# Patient Record
Sex: Female | Born: 1995 | Hispanic: Yes | Marital: Single | Smoking: Never smoker
Health system: Southern US, Community
[De-identification: ages and names within clinical notes are randomized; demographics above are authoritative.]

## PROBLEM LIST (undated history)

## (undated) DIAGNOSIS — N200 Calculus of kidney: Secondary | ICD-10-CM

---

## 2016-04-13 ENCOUNTER — Encounter (HOSPITAL_COMMUNITY): Payer: Self-pay | Admitting: *Deleted

## 2016-04-13 ENCOUNTER — Ambulatory Visit (HOSPITAL_COMMUNITY)
Admission: EM | Admit: 2016-04-13 | Discharge: 2016-04-13 | Disposition: A | Discharge status: Home / Self Care | Payer: Medicaid Other | Attending: Family Medicine | Admitting: Family Medicine

## 2016-04-13 DIAGNOSIS — J029 Acute pharyngitis, unspecified: Secondary | ICD-10-CM | POA: Insufficient documentation

## 2016-04-13 DIAGNOSIS — R05 Cough: Secondary | ICD-10-CM | POA: Diagnosis not present

## 2016-04-13 DIAGNOSIS — J40 Bronchitis, not specified as acute or chronic: Secondary | ICD-10-CM | POA: Insufficient documentation

## 2016-04-13 DIAGNOSIS — R059 Cough, unspecified: Secondary | ICD-10-CM

## 2016-04-13 LAB — POCT RAPID STREP A: Streptococcus, Group A Screen (Direct): NEGATIVE

## 2016-04-13 MED ORDER — IPRATROPIUM BROMIDE 0.06 % NA SOLN: 2.0000 | Freq: Four times a day (QID) | NASAL | 0 refills | Status: DC

## 2016-04-13 MED ORDER — BENZONATATE 100 MG PO CAPS: 200.0000 mg | ORAL_CAPSULE | Freq: Three times a day (TID) | ORAL | 0 refills | Status: DC | PRN

## 2016-04-13 MED ORDER — AZITHROMYCIN 250 MG PO TABS: 250.0000 mg | ORAL_TABLET | Freq: Every day | ORAL | 0 refills | Status: DC

## 2016-04-13 NOTE — ED Provider Notes (Signed)
CSN: [id number]     Arrival date & time 04/13/16  1857 History   First MD Initiated Contact with Patient 04/13/16 2031     Chief Complaint  Patient presents with  . Influenza   (Consider location/radiation/quality/duration/timing/severity/associated sxs/prior Treatment) Patient c/o cough, fever, congestion, nasal congestion, and uri sx's for 4 days.  She c/o sore throat.   The history is provided by the patient and a parent.  Influenza  Presenting symptoms: cough, fatigue, fever, headache, nausea, rhinorrhea and sore throat   Severity:  Moderate Onset quality:  Sudden Duration:  4 days Progression:  Worsening Chronicity:  New Relieved by:  Nothing Worsened by:  Nothing Ineffective treatments:  Decongestant, rest and OTC medications Associated symptoms: chills, ear pain and nasal congestion     History reviewed. No pertinent past medical history. History reviewed. No pertinent surgical history. History reviewed. No pertinent family history. Social History  Substance Use Topics  . Smoking status: Never Smoker  . Smokeless tobacco: Never Used  . Alcohol use Not on file   OB History    No data available     Review of Systems  Constitutional: Positive for chills, fatigue and fever.  HENT: Positive for congestion, ear pain, rhinorrhea and sore throat.   Eyes: Negative.   Respiratory: Positive for cough.   Cardiovascular: Negative.   Gastrointestinal: Positive for nausea.  Endocrine: Negative.   Allergic/Immunologic: Negative.   Neurological: Positive for headaches.  Hematological: Negative.   Psychiatric/Behavioral: Negative.     Allergies  Patient has no known allergies.  Home Medications   Prior to Admission medications   Medication Sig Start Date End Date Taking? Authorizing Provider  azithromycin (ZITHROMAX) 250 MG tablet Take 1 tablet (250 mg total) by mouth daily. Take first 2 tablets together, then 1 every day until finished. 04/13/16   Deatra CanterWilliam J Oxford, FNP   benzonatate (TESSALON) 100 MG capsule Take 2 capsules (200 mg total) by mouth 3 (three) times daily as needed for cough. 04/13/16   Deatra CanterWilliam J Oxford, FNP  ipratropium (ATROVENT) 0.06 % nasal spray Place 2 sprays into both nostrils 4 (four) times daily. 04/13/16   Deatra CanterWilliam J Oxford, FNP   Meds Ordered and Administered this Visit  Medications - No data to display  BP 110/78 (BP Location: Left Arm)   Pulse 112   Temp 99.5 F (37.5 C) (Oral)   Resp 18   SpO2 100%  No data found.   Physical Exam  Constitutional: She appears well-developed and well-nourished.  HENT:  Head: Normocephalic.  Right Ear: External ear normal.  Left Ear: External ear normal.  OPX erythematous  Eyes: Conjunctivae and EOM are normal. Pupils are equal, round, and reactive to light.  Neck: Normal range of motion. Neck supple.  Cardiovascular: Normal rate, regular rhythm and normal heart sounds.   Pulmonary/Chest: Effort normal and breath sounds normal.  Abdominal: Soft. Bowel sounds are normal.  Nursing note and vitals reviewed.   Urgent Care Course     Procedures (including critical care time)  Labs Review Labs Reviewed  POCT RAPID STREP A    Imaging Review No results found.   Visual Acuity Review  Right Eye Distance:   Left Eye Distance:   Bilateral Distance:    Right Eye Near:   Left Eye Near:    Bilateral Near:         MDM   1. Bronchitis   2. Cough   3. Sore Throat  Neg Rapid Strep  Zpak Tessalon perles  atrovent nasal spray  Push po fluids, rest, tylenol and motrin otc prn as directed for fever, arthralgias, and myalgias.  Follow up prn if sx's continue or persist.   Deatra Canter, FNP 04/13/16 2049

## 2016-04-13 NOTE — ED Triage Notes (Addendum)
Patient reports fever, cough, generalized weakness and nasal congestion x 4 days. Patient reports productive cough. Reports OTC meds have not helped.

## 2016-04-16 LAB — CULTURE, GROUP A STREP (THRC)

## 2016-05-24 ENCOUNTER — Encounter (HOSPITAL_COMMUNITY): Payer: Self-pay | Admitting: Emergency Medicine

## 2016-05-24 DIAGNOSIS — R930 Abnormal findings on diagnostic imaging of skull and head, not elsewhere classified: Secondary | ICD-10-CM | POA: Insufficient documentation

## 2016-05-24 DIAGNOSIS — Y939 Activity, unspecified: Secondary | ICD-10-CM | POA: Diagnosis not present

## 2016-05-24 DIAGNOSIS — Y999 Unspecified external cause status: Secondary | ICD-10-CM | POA: Diagnosis not present

## 2016-05-24 DIAGNOSIS — M79602 Pain in left arm: Secondary | ICD-10-CM | POA: Diagnosis not present

## 2016-05-24 DIAGNOSIS — M542 Cervicalgia: Secondary | ICD-10-CM | POA: Diagnosis not present

## 2016-05-24 DIAGNOSIS — Y92410 Unspecified street and highway as the place of occurrence of the external cause: Secondary | ICD-10-CM | POA: Diagnosis not present

## 2016-05-24 DIAGNOSIS — R079 Chest pain, unspecified: Secondary | ICD-10-CM | POA: Insufficient documentation
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
## 2016-05-25 ENCOUNTER — Emergency Department (HOSPITAL_COMMUNITY): Payer: Medicaid Other

## 2016-05-25 ENCOUNTER — Emergency Department (HOSPITAL_COMMUNITY)
Admission: EM | Admit: 2016-05-25 | Discharge: 2016-05-25 | Disposition: A | Discharge status: Home / Self Care | Payer: Medicaid Other | Attending: Emergency Medicine | Admitting: Emergency Medicine

## 2016-05-25 MED ORDER — IBUPROFEN 200 MG PO TABS
600.0000 mg | ORAL_TABLET | Freq: Once | ORAL | Status: AC
  Administered 2016-05-25: 600 mg via ORAL
  Filled 2016-05-25: qty 3
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
## 2016-05-25 NOTE — ED Notes (Addendum)
EKG given to EDP,ONI,MD., for review.

## 2016-07-06 ENCOUNTER — Ambulatory Visit (HOSPITAL_COMMUNITY): Payer: Medicaid Other | Admitting: Licensed Clinical Social Worker

## 2017-10-23 ENCOUNTER — Encounter (HOSPITAL_COMMUNITY): Payer: Self-pay

## 2017-10-23 ENCOUNTER — Emergency Department (HOSPITAL_COMMUNITY)
Admission: EM | Admit: 2017-10-23 | Discharge: 2017-10-23 | Disposition: A | Discharge status: Home / Self Care | Payer: No Typology Code available for payment source | Attending: Emergency Medicine | Admitting: Emergency Medicine

## 2017-10-23 ENCOUNTER — Emergency Department (HOSPITAL_COMMUNITY): Payer: No Typology Code available for payment source

## 2017-10-23 DIAGNOSIS — S8001XA Contusion of right knee, initial encounter: Secondary | ICD-10-CM | POA: Insufficient documentation

## 2017-10-23 DIAGNOSIS — Y999 Unspecified external cause status: Secondary | ICD-10-CM | POA: Insufficient documentation

## 2017-10-23 DIAGNOSIS — S39012A Strain of muscle, fascia and tendon of lower back, initial encounter: Secondary | ICD-10-CM | POA: Insufficient documentation

## 2017-10-23 DIAGNOSIS — Y92410 Unspecified street and highway as the place of occurrence of the external cause: Secondary | ICD-10-CM | POA: Insufficient documentation

## 2017-10-23 DIAGNOSIS — Y939 Activity, unspecified: Secondary | ICD-10-CM | POA: Diagnosis not present

## 2017-10-23 DIAGNOSIS — S8002XA Contusion of left knee, initial encounter: Secondary | ICD-10-CM | POA: Diagnosis not present

## 2017-10-23 DIAGNOSIS — S8000XA Contusion of unspecified knee, initial encounter: Secondary | ICD-10-CM

## 2017-10-23 DIAGNOSIS — S3992XA Unspecified injury of lower back, initial encounter: Secondary | ICD-10-CM | POA: Diagnosis present

## 2017-10-23 LAB — I-STAT BETA HCG BLOOD, ED (MC, WL, AP ONLY)

## 2017-10-23 MED ORDER — MORPHINE SULFATE (PF) 4 MG/ML IV SOLN
4.0000 mg | Freq: Once | INTRAVENOUS | Status: AC
  Administered 2017-10-23: 4 mg via INTRAVENOUS
  Filled 2017-10-23: qty 1

## 2017-10-23 MED ORDER — ONDANSETRON HCL 4 MG/2ML IJ SOLN
4.0000 mg | Freq: Once | INTRAMUSCULAR | Status: AC
  Administered 2017-10-23: 4 mg via INTRAVENOUS
  Filled 2017-10-23: qty 2

## 2017-10-23 MED ORDER — HYDROCODONE-ACETAMINOPHEN 5-325 MG PO TABS: 1.0000 | ORAL_TABLET | Freq: Four times a day (QID) | ORAL | 0 refills | Status: AC | PRN
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
## 2017-10-23 NOTE — Discharge Instructions (Signed)
Ice for 20 minutes every 2 hours while awake for the next 2 days.  Keep your legs elevated.  Ibuprofen 600 mg every 6 hours as needed for pain.  Hydrocodone is prescribed as needed for pain not relieved with ibuprofen.  Follow-up with your primary doctor if symptoms are not improving in the next week.
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
## 2017-10-23 NOTE — ED Notes (Signed)
Pt discharged from ED; instructions provided and scripts given; Pt encouraged to return to ED if symptoms worsen and to f/u with PCP; Pt verbalized understanding of all instructions 

## 2017-11-25 ENCOUNTER — Ambulatory Visit (INDEPENDENT_AMBULATORY_CARE_PROVIDER_SITE_OTHER): Payer: Self-pay | Admitting: Physician Assistant

## 2017-11-25 ENCOUNTER — Encounter (INDEPENDENT_AMBULATORY_CARE_PROVIDER_SITE_OTHER): Payer: Self-pay | Admitting: Physician Assistant

## 2017-11-25 ENCOUNTER — Other Ambulatory Visit: Payer: Self-pay

## 2017-11-25 ENCOUNTER — Encounter

## 2017-11-25 VITALS — BP 116/77 | HR 82 | Temp 98.2°F | Ht 59.0 in | Wt 112.0 lb

## 2017-11-25 DIAGNOSIS — M25562 Pain in left knee: Secondary | ICD-10-CM

## 2017-11-25 DIAGNOSIS — M545 Low back pain, unspecified: Secondary | ICD-10-CM

## 2017-11-25 DIAGNOSIS — M25561 Pain in right knee: Secondary | ICD-10-CM

## 2017-11-25 DIAGNOSIS — Z23 Encounter for immunization: Secondary | ICD-10-CM

## 2017-11-25 MED ORDER — CYCLOBENZAPRINE HCL 10 MG PO TABS: 10.0000 mg | ORAL_TABLET | Freq: Every day | ORAL | 0 refills | Status: AC

## 2017-11-25 MED ORDER — NAPROXEN 500 MG PO TABS: 500.0000 mg | ORAL_TABLET | Freq: Two times a day (BID) | ORAL | 0 refills | Status: AC

## 2017-11-25 NOTE — Patient Instructions (Signed)
RICE for Routine Care of Injuries Many injuries can be cared for using rest, ice, compression, and elevation (RICE therapy). Using RICE therapy can help to lessen pain and swelling. It can help your body to heal. Rest Reduce your normal activities and avoid using the injured part of your body. You can go back to your normal activities when you feel okay and your doctor says it is okay. Ice Do not put ice on your bare skin.  Put ice in a plastic bag.  Place a towel between your skin and the bag.  Leave the ice on for 20 minutes, 2-3 times a day.  Do this for as long as told by your doctor. Compression Compression means putting pressure on the injured area. This can be done with an elastic bandage. If an elastic bandage has been applied:  Remove and reapply the bandage every 3-4 hours or as told by your doctor.  Make sure the bandage is not wrapped too tight. Wrap the bandage more loosely if part of your body beyond the bandage is blue, swollen, cold, painful, or loses feeling (numb).  See your doctor if the bandage seems to make your problems worse.  Elevation Elevation means keeping the injured area raised. Raise the injured area above your heart or the center of your chest if you can. When should I get help? You should get help if:  You keep having pain and swelling.  Your symptoms get worse.  Get help right away if: You should get help right away if:  You have sudden bad pain at or below the area of your injury.  You have redness or more swelling around your injury.  You have tingling or numbness at or below the injury that does not go away when you take off the bandage.  This information is not intended to replace advice given to you by your health care provider. Make sure you discuss any questions you have with your health care provider. Document Released: 08/25/2007 Document Revised: 02/03/2016 Document Reviewed: 02/13/2014 Elsevier Interactive Patient Education  2017  [employer]  
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
## 2017-11-29 ENCOUNTER — Other Ambulatory Visit (INDEPENDENT_AMBULATORY_CARE_PROVIDER_SITE_OTHER): Payer: Self-pay | Admitting: Physician Assistant

## 2017-11-29 ENCOUNTER — Telehealth (INDEPENDENT_AMBULATORY_CARE_PROVIDER_SITE_OTHER): Payer: Self-pay | Admitting: Physician Assistant

## 2017-11-29 DIAGNOSIS — M545 Low back pain, unspecified: Secondary | ICD-10-CM

## 2017-11-29 DIAGNOSIS — M25561 Pain in right knee: Secondary | ICD-10-CM

## 2017-11-29 DIAGNOSIS — M25562 Pain in left knee: Secondary | ICD-10-CM

## 2017-11-29 NOTE — Telephone Encounter (Signed)
Patient called requesting a letter for work stating that she is not able to pick up heavy things due to knee and back being inflamed.   Please Advice [phone]   Thank you Sabrina Robbins

## 2017-11-29 NOTE — Telephone Encounter (Signed)
Work note written. Please have patient pick up.

## 2017-11-29 NOTE — Telephone Encounter (Signed)
FWD to PCP. Sabrina Robbins, CMA  

## 2017-11-30 NOTE — Telephone Encounter (Signed)
Patient is aware.  ° °Sabrina Robbins  °
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
## 2022-11-21 ENCOUNTER — Emergency Department (HOSPITAL_BASED_OUTPATIENT_CLINIC_OR_DEPARTMENT_OTHER): Payer: Medicaid Other

## 2022-11-21 ENCOUNTER — Emergency Department (HOSPITAL_BASED_OUTPATIENT_CLINIC_OR_DEPARTMENT_OTHER)
Admission: EM | Admit: 2022-11-21 | Discharge: 2022-11-21 | Disposition: A | Discharge status: Home / Self Care | Payer: Medicaid Other | Attending: Emergency Medicine | Admitting: Emergency Medicine

## 2022-11-21 ENCOUNTER — Other Ambulatory Visit: Payer: Self-pay

## 2022-11-21 ENCOUNTER — Encounter (HOSPITAL_BASED_OUTPATIENT_CLINIC_OR_DEPARTMENT_OTHER): Payer: Self-pay

## 2022-11-21 DIAGNOSIS — R109 Unspecified abdominal pain: Secondary | ICD-10-CM | POA: Diagnosis present

## 2022-11-21 DIAGNOSIS — R319 Hematuria, unspecified: Secondary | ICD-10-CM | POA: Insufficient documentation

## 2022-11-21 DIAGNOSIS — R112 Nausea with vomiting, unspecified: Secondary | ICD-10-CM | POA: Diagnosis not present

## 2022-11-21 LAB — CBC WITH DIFFERENTIAL/PLATELET
Abs Immature Granulocytes: 0.02 10*3/uL (ref 0.00–0.07)
Basophils Absolute: 0.1 10*3/uL (ref 0.0–0.1)
Basophils Relative: 1 %
Eosinophils Absolute: 0.3 10*3/uL (ref 0.0–0.5)
Eosinophils Relative: 2 %
HCT: 36 % (ref 36.0–46.0)
Hemoglobin: 12.5 g/dL (ref 12.0–15.0)
Immature Granulocytes: 0 %
Lymphocytes Relative: 52 %
Lymphs Abs: 5.7 10*3/uL — ABNORMAL HIGH (ref 0.7–4.0)
MCH: 30.5 pg (ref 26.0–34.0)
MCHC: 34.7 g/dL (ref 30.0–36.0)
MCV: 87.8 fL (ref 80.0–100.0)
Monocytes Absolute: 0.9 10*3/uL (ref 0.1–1.0)
Monocytes Relative: 8 %
Neutro Abs: 4.1 10*3/uL (ref 1.7–7.7)
Neutrophils Relative %: 37 %
Platelets: 290 10*3/uL (ref 150–400)
RBC: 4.1 MIL/uL (ref 3.87–5.11)
RDW: 13 % (ref 11.5–15.5)
WBC: 11 10*3/uL — ABNORMAL HIGH (ref 4.0–10.5)
nRBC: 0 % (ref 0.0–0.2)

## 2022-11-21 LAB — URINALYSIS, ROUTINE W REFLEX MICROSCOPIC
Bilirubin Urine: NEGATIVE
Glucose, UA: NEGATIVE mg/dL
Leukocytes,Ua: NEGATIVE
Nitrite: NEGATIVE
Protein, ur: 30 mg/dL — AB
RBC / HPF: >50 RBC/hpf (ref 0–5)
Specific Gravity, Urine: 1.034 — ABNORMAL HIGH (ref 1.005–1.030)
pH: 5.5 (ref 5.0–8.0)

## 2022-11-21 LAB — BASIC METABOLIC PANEL
Anion gap: 7 (ref 5–15)
BUN: 14 mg/dL (ref 6–20)
CO2: 23 mmol/L (ref 22–32)
Calcium: 9.2 mg/dL (ref 8.9–10.3)
Chloride: 107 mmol/L (ref 98–111)
Creatinine, Ser: 0.84 mg/dL (ref 0.44–1.00)
GFR, Estimated: >60 mL/min (ref >60)
Glucose, Bld: 111 mg/dL — ABNORMAL HIGH (ref 70–99)
Potassium: 3.5 mmol/L (ref 3.5–5.1)
Sodium: 137 mmol/L (ref 135–145)

## 2022-11-21 LAB — PREGNANCY, URINE: Preg Test, Ur: NEGATIVE

## 2022-11-21 MED ORDER — SODIUM CHLORIDE 0.9 % IV BOLUS
1000.0000 mL | Freq: Once | INTRAVENOUS | Status: AC
  Administered 2022-11-21: 1000 mL via INTRAVENOUS

## 2022-11-21 MED ORDER — ONDANSETRON HCL 4 MG/2ML IJ SOLN
4.0000 mg | Freq: Once | INTRAMUSCULAR | Status: AC
  Administered 2022-11-21: 4 mg via INTRAVENOUS
  Filled 2022-11-21: qty 2

## 2022-11-21 MED ORDER — FENTANYL CITRATE PF 50 MCG/ML IJ SOSY
50.0000 ug | PREFILLED_SYRINGE | Freq: Once | INTRAMUSCULAR | Status: AC
  Administered 2022-11-21: 50 ug via INTRAVENOUS
  Filled 2022-11-21: qty 1

## 2022-11-21 MED ORDER — KETOROLAC TROMETHAMINE 30 MG/ML IJ SOLN
15.0000 mg | Freq: Once | INTRAMUSCULAR | Status: AC
  Administered 2022-11-21: 15 mg via INTRAVENOUS
  Filled 2022-11-21: qty 1

## 2022-11-21 NOTE — ED Triage Notes (Addendum)
Pt c/o L flank pain, onset 1-1.5hrs ago, associated nausea. Hx kidney stones. Denies hematuria. Ibuprofen approx 1hr ago

## 2022-11-21 NOTE — ED Provider Notes (Signed)
[place] EMERGENCY DEPARTMENT AT [facility]  Provider Note  CSN: [id number] Arrival date & time: 11/21/22 0037  History Chief Complaint  Patient presents with   Flank Pain   Nausea    Sabrina Robbins is a 27 y.o. female with remote history of renal stones reports sudden onset of sharp L flank pain about prior to arrival with nausea and vomiting. No dysuria, hematuria or fever.    Home Medications Prior to Admission medications   Medication Sig Start Date End Date Taking? Authorizing Provider  cyclobenzaprine (FLEXERIL) 10 MG tablet Take 1 tablet (10 mg total) by mouth at bedtime. 11/25/17   Loletta Specter, PA-C  etonogestrel (NEXPLANON) 68 MG IMPL implant 1 each by Subdermal route once.    [provider]  HYDROcodone-acetaminophen (NORCO) 5-325 MG tablet Take 1-2 tablets by mouth every 6 (six) hours as needed. 10/23/17   Geoffery Lyons, MD  methocarbamol (ROBAXIN) 500 MG tablet Take 500 mg by mouth as needed. 10/28/17   [provider]  naproxen (NAPROSYN) 500 MG tablet Take 1 tablet (500 mg total) by mouth 2 (two) times daily with a meal. 11/25/17   Loletta Specter, PA-C     Allergies    Patient has no known allergies.   Review of Systems   Review of Systems Please see HPI for pertinent positives and negatives  Physical Exam BP 100/88   Pulse 64   Temp 98.5 F (36.9 C)   Resp 18   SpO2 100%   Physical Exam Vitals and nursing note reviewed.  Constitutional:      Appearance: Normal appearance.  HENT:     Head: Normocephalic and atraumatic.     Nose: Nose normal.     Mouth/Throat:     Mouth: Mucous membranes are moist.  Eyes:     Extraocular Movements: Extraocular movements intact.     Conjunctiva/sclera: Conjunctivae normal.  Cardiovascular:     Rate and Rhythm: Normal rate.  Pulmonary:     Effort: Pulmonary effort is normal.     Breath sounds: Normal breath sounds.  Abdominal:     General: Abdomen is flat.      Palpations: Abdomen is soft.     Tenderness: There is no abdominal tenderness.  Musculoskeletal:        General: No swelling. Normal range of motion.     Cervical back: Neck supple.  Skin:    General: Skin is warm and dry.  Neurological:     General: No focal deficit present.     Mental Status: She is alert.  Psychiatric:        Mood and Affect: Mood normal.     ED Results / Procedures / Treatments   EKG None  Procedures Procedures  Medications Ordered in the ED Medications  fentaNYL (SUBLIMAZE) injection 50 mcg (50 mcg Intravenous Given 11/21/22 0058)  ondansetron (ZOFRAN) injection 4 mg (4 mg Intravenous Given 11/21/22 0058)  sodium chloride 0.9 % bolus 1,000 mL (0 mLs Intravenous Stopped 11/21/22 0222)  ketorolac (TORADOL) 30 MG/ML injection 15 mg (15 mg Intravenous Given 11/21/22 0221)    Initial Impression and Plan  Patient here with symptoms most consistent with renal colic. Less likely MSK pain. Will check labs and send for CT. Pain/nausea meds for comfort.   ED Course   Clinical Course as of 11/21/22 0223  Wynelle Link Nov 21, 2022  0100 CBC is unremarkable.  [CS]  0104 HCG is neg. UA with blood consistent with suspected  renal stone. No infection.  [CS]  0119 BMP unremarkable.  [CS]  0222 I personally viewed the images from radiology studies and agree with radiologist interpretation: CT neg for ureteral stone. There is punctate stones in kidney patient is aware of. Pain is improved but not resolved and not more anterior. Suspect a passed renal stone. Will give a dose of Toradol but otherwise she is feeling better and eager to go home.  [CS]    Clinical Course User Index [CS] Pollyann Savoy, MD     MDM Rules/Calculators/A&P Medical Decision Making Problems Addressed: Hematuria, unspecified type: acute illness or injury Left flank pain: acute illness or injury  Amount and/or Complexity of Data Reviewed Labs: ordered. Decision-making details documented in ED  Course. Radiology: ordered and independent interpretation performed. Decision-making details documented in ED Course.  Risk Prescription drug management. Parenteral controlled substances.     Final Clinical Impression(s) / ED Diagnoses Final diagnoses:  Left flank pain  Hematuria, unspecified type    Rx / DC Orders ED Discharge Orders     None        Pollyann Savoy, MD 11/21/22 [phone]

## 2023-05-31 ENCOUNTER — Emergency Department (HOSPITAL_BASED_OUTPATIENT_CLINIC_OR_DEPARTMENT_OTHER)

## 2023-05-31 ENCOUNTER — Emergency Department (HOSPITAL_BASED_OUTPATIENT_CLINIC_OR_DEPARTMENT_OTHER)
Admission: EM | Admit: 2023-05-31 | Discharge: 2023-05-31 | Disposition: A | Discharge status: Home / Self Care | Attending: Emergency Medicine | Admitting: Emergency Medicine

## 2023-05-31 ENCOUNTER — Encounter (HOSPITAL_BASED_OUTPATIENT_CLINIC_OR_DEPARTMENT_OTHER): Payer: Self-pay | Admitting: Emergency Medicine

## 2023-05-31 DIAGNOSIS — N201 Calculus of ureter: Secondary | ICD-10-CM | POA: Diagnosis not present

## 2023-05-31 DIAGNOSIS — D72829 Elevated white blood cell count, unspecified: Secondary | ICD-10-CM | POA: Diagnosis not present

## 2023-05-31 DIAGNOSIS — R109 Unspecified abdominal pain: Secondary | ICD-10-CM | POA: Diagnosis present

## 2023-05-31 HISTORY — DX: Calculus of kidney: N20.0

## 2023-05-31 LAB — CBC
HCT: 37.2 % (ref 36.0–46.0)
Hemoglobin: 12.6 g/dL (ref 12.0–15.0)
MCH: 29.8 pg (ref 26.0–34.0)
MCHC: 33.9 g/dL (ref 30.0–36.0)
MCV: 87.9 fL (ref 80.0–100.0)
Platelets: 288 10*3/uL (ref 150–400)
RBC: 4.23 MIL/uL (ref 3.87–5.11)
RDW: 12.9 % (ref 11.5–15.5)
WBC: 10.6 10*3/uL — ABNORMAL HIGH (ref 4.0–10.5)
nRBC: 0 % (ref 0.0–0.2)

## 2023-05-31 LAB — COMPREHENSIVE METABOLIC PANEL
ALT: 8 U/L (ref 0–44)
AST: 18 U/L (ref 15–41)
Albumin: 4.5 g/dL (ref 3.5–5.0)
Alkaline Phosphatase: 63 U/L (ref 38–126)
Anion gap: 11 (ref 5–15)
BUN: 10 mg/dL (ref 6–20)
CO2: 22 mmol/L (ref 22–32)
Calcium: 9.7 mg/dL (ref 8.9–10.3)
Chloride: 105 mmol/L (ref 98–111)
Creatinine, Ser: 0.8 mg/dL (ref 0.44–1.00)
GFR, Estimated: >60 mL/min (ref >60)
Glucose, Bld: 99 mg/dL (ref 70–99)
Potassium: 3.8 mmol/L (ref 3.5–5.1)
Sodium: 138 mmol/L (ref 135–145)
Total Bilirubin: 0.7 mg/dL (ref 0.0–1.2)
Total Protein: 7.2 g/dL (ref 6.5–8.1)

## 2023-05-31 LAB — URINALYSIS, ROUTINE W REFLEX MICROSCOPIC
Bacteria, UA: NONE SEEN
Bilirubin Urine: NEGATIVE
Glucose, UA: NEGATIVE mg/dL
Ketones, ur: >80 mg/dL — AB
Nitrite: NEGATIVE
Protein, ur: 30 mg/dL — AB
RBC / HPF: >50 RBC/hpf (ref 0–5)
Specific Gravity, Urine: 1.028 (ref 1.005–1.030)
pH: 5.5 (ref 5.0–8.0)

## 2023-05-31 LAB — LIPASE, BLOOD: Lipase: 21 U/L (ref 11–51)

## 2023-05-31 LAB — HCG, SERUM, QUALITATIVE: Preg, Serum: NEGATIVE

## 2023-05-31 LAB — PREGNANCY, URINE: Preg Test, Ur: NEGATIVE

## 2023-05-31 MED ORDER — OXYCODONE-ACETAMINOPHEN 5-325 MG PO TABS: 1.0000 | ORAL_TABLET | Freq: Four times a day (QID) | ORAL | 0 refills | Status: AC | PRN

## 2023-05-31 MED ORDER — KETOROLAC TROMETHAMINE 30 MG/ML IJ SOLN
30.0000 mg | Freq: Once | INTRAMUSCULAR | Status: AC
  Administered 2023-05-31: 30 mg via INTRAVENOUS
  Filled 2023-05-31: qty 1

## 2023-05-31 MED ORDER — MORPHINE SULFATE (PF) 2 MG/ML IV SOLN
2.0000 mg | Freq: Once | INTRAVENOUS | Status: AC
  Administered 2023-05-31: 2 mg via INTRAVENOUS
  Filled 2023-05-31: qty 1

## 2023-05-31 MED ORDER — ONDANSETRON HCL 4 MG/2ML IJ SOLN
4.0000 mg | Freq: Once | INTRAMUSCULAR | Status: AC
  Administered 2023-05-31: 4 mg via INTRAVENOUS
  Filled 2023-05-31: qty 2

## 2023-05-31 MED ORDER — ONDANSETRON 4 MG PO TBDP
4.0000 mg | ORAL_TABLET | Freq: Three times a day (TID) | ORAL | 0 refills | Status: AC | PRN
Start: 2023-05-31 — End: ?

## 2023-05-31 MED ORDER — FENTANYL CITRATE PF 50 MCG/ML IJ SOSY
50.0000 ug | PREFILLED_SYRINGE | INTRAMUSCULAR | Status: DC | PRN
  Administered 2023-05-31: 50 ug via INTRAVENOUS
  Filled 2023-05-31: qty 1

## 2023-05-31 MED ORDER — TAMSULOSIN HCL 0.4 MG PO CAPS: 0.4000 mg | ORAL_CAPSULE | Freq: Every day | ORAL | 0 refills | Status: AC

## 2023-05-31 NOTE — Discharge Instructions (Signed)
 You are seen in the emergency department today for concerns of abdominal pain.  Your CT scan shows a kidney stone close to entering the bladder on your right side.  This is the likely cause of your pain.  There does not appear to be any infection with your urine at this time so you would not be started on antibiotics.  I have sent a combination of several medications to your pharmacy though including pain medicine, nausea medicine, and a medication to increase the urine output.  I did also provide with contact formation for our urology group which she should reach out to you if you feel that you are not having improvement in your symptoms.  We do typically recommend following up with them within around 3 to 5 days.

## 2023-05-31 NOTE — ED Provider Notes (Signed)
 [place] EMERGENCY DEPARTMENT AT [facility] Provider Note   CSN: [id number] Arrival date & time: 05/31/23  1806     History Chief Complaint  Patient presents with   Abdominal Pain    Sabrina Robbins is a 28 y.o. female.  Patient presents today to the emergency department concerns of abdominal pain.  Endorsing that Sabrina Robbins began experience right-sided flank pain with some associated nausea and vomiting that started around 5 PM this evening.  Does have a history of prior kidney stones and states this feels somewhat similar but worse than that.  No recent fever chills or bodyaches.  Does endorse some blood in her urine but denies any dysuria.   Abdominal Pain      Home Medications Prior to Admission medications   Medication Sig Start Date End Date Taking? Authorizing Provider  ondansetron (ZOFRAN-ODT) 4 MG disintegrating tablet Take 1 tablet (4 mg total) by mouth every 8 (eight) hours as needed for nausea or vomiting. 05/31/23  Yes Smitty Knudsen, PA-C  oxyCODONE-acetaminophen (PERCOCET/ROXICET) 5-325 MG tablet Take 1 tablet by mouth every 6 (six) hours as needed for severe pain (pain score 7-10). 05/31/23  Yes Temple Sporer A, PA-C  tamsulosin (FLOMAX) 0.4 MG CAPS capsule Take 1 capsule (0.4 mg total) by mouth at bedtime. 05/31/23  Yes Smitty Knudsen, PA-C  cyclobenzaprine (FLEXERIL) 10 MG tablet Take 1 tablet (10 mg total) by mouth at bedtime. 11/25/17   Loletta Specter, PA-C  etonogestrel (NEXPLANON) 68 MG IMPL implant 1 each by Subdermal route once.    [provider]  HYDROcodone-acetaminophen (NORCO) 5-325 MG tablet Take 1-2 tablets by mouth every 6 (six) hours as needed. 10/23/17   Geoffery Lyons, MD  methocarbamol (ROBAXIN) 500 MG tablet Take 500 mg by mouth as needed. 10/28/17   [provider]  naproxen (NAPROSYN) 500 MG tablet Take 1 tablet (500 mg total) by mouth 2 (two) times daily with a meal. 11/25/17   Loletta Specter, PA-C      Allergies     Patient has no known allergies.    Review of Systems   Review of Systems  Gastrointestinal:  Positive for abdominal pain.  All other systems reviewed and are negative.   Physical Exam Updated Vital Signs BP 108/61   Pulse 84   Temp 98.3 F (36.8 C) (Oral)   Resp 16   SpO2 100%  Physical Exam Vitals and nursing note reviewed.  Constitutional:      General: Sabrina Robbins is not in acute distress.    Appearance: Sabrina Robbins is well-developed.  HENT:     Head: Normocephalic and atraumatic.  Eyes:     Conjunctiva/sclera: Conjunctivae normal.  Cardiovascular:     Rate and Rhythm: Normal rate and regular rhythm.     Heart sounds: No murmur heard. Pulmonary:     Effort: Pulmonary effort is normal. No respiratory distress.     Breath sounds: Normal breath sounds.  Abdominal:     Palpations: Abdomen is soft.     Tenderness: There is abdominal tenderness in the right lower quadrant. There is right CVA tenderness.  Musculoskeletal:        General: No swelling.     Cervical back: Neck supple.  Skin:    General: Skin is warm and dry.     Capillary Refill: Capillary refill takes less than 2 seconds.  Neurological:     Mental Status: Sabrina Robbins is alert.  Psychiatric:        Mood and Affect:  Mood normal.     ED Results / Procedures / Treatments   Labs (all labs ordered are listed, but only abnormal results are displayed) Labs Reviewed  CBC - Abnormal; Notable for the following components:      Result Value   WBC 10.6 (*)    All other components within normal limits  URINALYSIS, ROUTINE W REFLEX MICROSCOPIC - Abnormal; Notable for the following components:   APPearance HAZY (*)    Hgb urine dipstick LARGE (*)    Ketones, ur >80 (*)    Protein, ur 30 (*)    Leukocytes,Ua SMALL (*)    All other components within normal limits  LIPASE, BLOOD  COMPREHENSIVE METABOLIC PANEL  PREGNANCY, URINE  HCG, SERUM, QUALITATIVE    EKG None  Radiology CT Renal Stone Study Result Date:  05/31/2023 CLINICAL DATA:  Right side flank pain and abdominal pain with nausea and vomiting. EXAM: CT ABDOMEN AND PELVIS WITHOUT CONTRAST TECHNIQUE: Multidetector CT imaging of the abdomen and pelvis was performed following the standard protocol without IV contrast. RADIATION DOSE REDUCTION: This exam was performed according to the departmental dose-optimization program which includes automated exposure control, adjustment of the mA and/or kV according to patient size and/or use of iterative reconstruction technique. COMPARISON:  11/21/2022. FINDINGS: Lower chest: No acute abnormality. Hepatobiliary: No focal liver abnormality is seen. No gallstones, gallbladder wall thickening, or biliary dilatation. Pancreas: Unremarkable. No pancreatic ductal dilatation or surrounding inflammatory changes. Spleen: Normal in size without focal abnormality. Adrenals/Urinary Tract: The adrenal glands are within normal limits. No renal calculus is seen bilaterally. There is mild obstructive uropathy on the right with a 2-3 mm calculus in the distal right ureter at the UVJ. No obstructive uropathy on the left. The bladder is decompressed. Stomach/Bowel: Stomach is within normal limits. Appendix appears normal. No evidence of bowel wall thickening, distention, or inflammatory changes. No free air or pneumatosis. Vascular/Lymphatic: No significant vascular findings are present. No enlarged abdominal or pelvic lymph nodes. Reproductive: The uterus is within normal limits. A 2.8 cm cyst is noted in the right ovary, likely dominant follicle. No adnexal mass on the left. Other: No abdominopelvic ascites. Musculoskeletal: No acute osseous abnormality. IMPRESSION: Mild obstructive uropathy on the right with a 2-3 mm calculus in the distal right ureter at the UVJ. Electronically Signed   By: Thornell Sartorius M.D.   On: 05/31/2023 22:41    Procedures Procedures    Medications Ordered in ED Medications  fentaNYL (SUBLIMAZE) injection 50  mcg (50 mcg Intravenous Given 05/31/23 1834)  ondansetron (ZOFRAN) injection 4 mg (4 mg Intravenous Given 05/31/23 1834)  ketorolac (TORADOL) 30 MG/ML injection 30 mg (30 mg Intravenous Given 05/31/23 2116)  morphine (PF) 2 MG/ML injection 2 mg (2 mg Intravenous Given 05/31/23 2308)    ED Course/ Medical Decision Making/ A&P                                 Medical Decision Making Amount and/or Complexity of Data Reviewed Labs: ordered. Radiology: ordered.  Risk Prescription drug management.   This patient presents to the ED for concern of abdominal pain.  Differential diagnosis includes appendicitis, cholelithiasis, urolithiasis, nephrolithiasis   Lab Tests:  I Ordered, and personally interpreted labs.  The pertinent results include: CBC with mild leukocytosis at 10.6, CMP unremarkable, lipase normal, UA without obvious signs of infection with large hemoglobin seen but no bacteria and only some small leukocytes, urine  pregnancy negative   Imaging Studies ordered:  I ordered imaging studies including CT renal stone study I independently visualized and interpreted imaging which showed Mild obstructive uropathy on the right with a 2-3 mm calculus in the distal right ureter at the UVJ. I agree with the radiologist interpretation   Medicines ordered and prescription drug management:  I ordered medication including Toradol, Zofran, fentanyl, morphine for pain, nausea Reevaluation of the patient after these medicines showed that the patient improved I have reviewed the patients home medicines and have made adjustments as needed   Problem List / ED Course:  Patient presents ED today with concerns of sudden onset of right flank pain around 5 PM.  States Sabrina Robbins has a prior history of kidney stones and this feels somewhat similar to that.  Reports the pain today is worse than her prior episode of kidney stones.  Sabrina Robbins endorses some nausea and vomiting but no diarrhea.  No recent sick contacts.   No fever chills or bodyaches. Physical exam does reveal right CVA tenderness.  Pain radiates towards the right lower quadrant.  Will obtain CT imaging to rule out possible stone but this appears to be likely given patient's hematuria.  Labs ordered for assessment of any evidence of severe infection in the stone or renal abnormalities. CBC with mild leukocytosis of 10.6.  CMP unremarkable.  UA without obvious signs of infection 0 any leukocytes but no nitrates no bacteria seen. CT renal study shows a stone measuring 2 to 3 mm at the distal right ureter close to the UVJ.  Given no evidence of infected stone, will plan on discharging patient home with combination therapy of pain medicine, antiemetics, and expulsive medication such as Flomax.  Will also provide patient with contact remission to reach out to [facility] urology for evaluation follow-up if symptoms not improving.  Return precautions discussed.  Patient is otherwise stable and agreeable with plans for outpatient follow-up and discharge home.   Discharged home in stable condition.   Final Clinical Impression(s) / ED Diagnoses Final diagnoses:  Calculus of ureter    Rx / DC Orders ED Discharge Orders          Ordered    oxyCODONE-acetaminophen (PERCOCET/ROXICET) 5-325 MG tablet  Every 6 hours PRN        05/31/23 2300    ondansetron (ZOFRAN-ODT) 4 MG disintegrating tablet  Every 8 hours PRN        05/31/23 2300    tamsulosin (FLOMAX) 0.4 MG CAPS capsule  Daily at bedtime        05/31/23 2300              Smitty Knudsen, PA-C 05/31/23 2335    Gwyneth Sprout, MD 06/01/23 2318

## 2023-05-31 NOTE — ED Notes (Signed)
 Pt has hx of kidney stones, pt describes left abdominal and left flank pain that started at 1700.  +N/V

## 2023-05-31 NOTE — ED Triage Notes (Signed)
 Abdominal pain right side flank pain Nausea and vomiting Started around 5 pm Hx kidney stone  Reports similar feeling
